# Patient Record
Sex: Female | Born: 1940 | Hispanic: No | Marital: Single | Smoking: Never smoker
Health system: Southern US, Community
[De-identification: ages and names within clinical notes are randomized; demographics above are authoritative.]

## PROBLEM LIST (undated history)

## (undated) DIAGNOSIS — I10 Essential (primary) hypertension: Secondary | ICD-10-CM

---

## 1999-10-24 ENCOUNTER — Other Ambulatory Visit: Admission: RE | Admit: 1999-10-24 | Discharge: 1999-10-24 | Payer: Self-pay | Admitting: Family Medicine

## 2001-04-05 ENCOUNTER — Other Ambulatory Visit: Admission: RE | Admit: 2001-04-05 | Discharge: 2001-04-05 | Payer: Self-pay | Admitting: Family Medicine

## 2005-02-17 ENCOUNTER — Other Ambulatory Visit: Admission: RE | Admit: 2005-02-17 | Discharge: 2005-02-17 | Payer: Self-pay | Admitting: Family Medicine

## 2006-07-30 ENCOUNTER — Other Ambulatory Visit: Admission: RE | Admit: 2006-07-30 | Discharge: 2006-07-30 | Payer: Self-pay | Admitting: Family Medicine

## 2008-03-03 ENCOUNTER — Encounter: Admission: RE | Admit: 2008-03-03 | Discharge: 2008-03-03 | Payer: Self-pay | Admitting: Family Medicine

## 2008-10-15 ENCOUNTER — Other Ambulatory Visit: Admission: RE | Admit: 2008-10-15 | Discharge: 2008-10-15 | Payer: Self-pay | Admitting: Family Medicine

## 2009-09-21 ENCOUNTER — Other Ambulatory Visit: Admission: RE | Admit: 2009-09-21 | Discharge: 2009-09-21 | Payer: Self-pay | Admitting: Family Medicine

## 2009-11-22 ENCOUNTER — Encounter: Admission: RE | Admit: 2009-11-22 | Discharge: 2009-11-22 | Payer: Self-pay | Admitting: Family Medicine

## 2009-11-29 ENCOUNTER — Encounter (INDEPENDENT_AMBULATORY_CARE_PROVIDER_SITE_OTHER): Payer: Self-pay | Admitting: Obstetrics and Gynecology

## 2009-11-29 ENCOUNTER — Ambulatory Visit (HOSPITAL_COMMUNITY): Admission: RE | Admit: 2009-11-29 | Discharge: 2009-11-30 | Payer: Self-pay | Admitting: Obstetrics and Gynecology

## 2010-06-20 ENCOUNTER — Encounter: Admission: RE | Admit: 2010-06-20 | Discharge: 2010-06-20 | Payer: Self-pay | Admitting: Family Medicine

## 2010-08-05 HISTORY — PX: LUMBAR DISC SURGERY: SHX700

## 2010-10-24 ENCOUNTER — Other Ambulatory Visit: Payer: Self-pay | Admitting: Family Medicine

## 2010-12-19 LAB — COMPREHENSIVE METABOLIC PANEL
ALT: 13 U/L (ref 0–35)
AST: 19 U/L (ref 0–37)
BUN: 7 mg/dL (ref 6–23)
Chloride: 101 mEq/L (ref 96–112)
GFR calc Af Amer: >60 mL/min (ref >60)
GFR calc non Af Amer: >60 mL/min (ref >60)
Potassium: 3.6 mEq/L (ref 3.5–5.1)
Sodium: 140 mEq/L (ref 135–145)

## 2010-12-19 LAB — CBC
HCT: 30.2 % — ABNORMAL LOW (ref 36.0–46.0)
MCHC: 33 g/dL (ref 30.0–36.0)
Platelets: 216 10*3/uL (ref 150–400)
WBC: 13.4 10*3/uL — ABNORMAL HIGH (ref 4.0–10.5)

## 2011-02-01 HISTORY — PX: POSTERIOR LUMBAR FUSION: SHX6036

## 2011-02-23 ENCOUNTER — Ambulatory Visit: Payer: Medicaid Other | Admitting: Rehabilitative and Restorative Service Providers"

## 2011-03-15 ENCOUNTER — Ambulatory Visit: Payer: Medicaid Other | Attending: Neurology | Admitting: Rehabilitative and Restorative Service Providers"

## 2011-03-15 DIAGNOSIS — M545 Low back pain, unspecified: Secondary | ICD-10-CM | POA: Insufficient documentation

## 2011-03-15 DIAGNOSIS — IMO0001 Reserved for inherently not codable concepts without codable children: Secondary | ICD-10-CM | POA: Insufficient documentation

## 2011-03-15 DIAGNOSIS — M2569 Stiffness of other specified joint, not elsewhere classified: Secondary | ICD-10-CM | POA: Insufficient documentation

## 2011-03-31 ENCOUNTER — Ambulatory Visit: Payer: Medicare Other | Attending: Neurology | Admitting: Physical Therapy

## 2011-03-31 DIAGNOSIS — IMO0001 Reserved for inherently not codable concepts without codable children: Secondary | ICD-10-CM | POA: Insufficient documentation

## 2011-03-31 DIAGNOSIS — M545 Low back pain, unspecified: Secondary | ICD-10-CM | POA: Insufficient documentation

## 2011-03-31 DIAGNOSIS — M2569 Stiffness of other specified joint, not elsewhere classified: Secondary | ICD-10-CM | POA: Insufficient documentation

## 2011-04-07 ENCOUNTER — Ambulatory Visit: Payer: Medicare Other | Admitting: Rehabilitative and Restorative Service Providers"

## 2011-04-14 ENCOUNTER — Ambulatory Visit: Payer: Medicare Other | Admitting: Rehabilitative and Restorative Service Providers"

## 2011-04-19 ENCOUNTER — Ambulatory Visit: Payer: Self-pay | Attending: Neurology | Admitting: Rehabilitative and Restorative Service Providers"

## 2011-04-19 DIAGNOSIS — M2560 Stiffness of unspecified joint, not elsewhere classified: Secondary | ICD-10-CM | POA: Insufficient documentation

## 2011-04-19 DIAGNOSIS — IMO0001 Reserved for inherently not codable concepts without codable children: Secondary | ICD-10-CM | POA: Insufficient documentation

## 2011-04-25 ENCOUNTER — Encounter: Payer: Medicaid Other | Admitting: Rehabilitative and Restorative Service Providers"

## 2011-04-27 ENCOUNTER — Encounter: Payer: Medicaid Other | Admitting: Rehabilitative and Restorative Service Providers"

## 2011-05-08 ENCOUNTER — Encounter: Payer: Medicaid Other | Admitting: Rehabilitative and Restorative Service Providers"

## 2011-05-10 ENCOUNTER — Encounter: Payer: Medicaid Other | Admitting: Rehabilitative and Restorative Service Providers"

## 2011-09-26 HISTORY — PX: CATARACT EXTRACTION, BILATERAL: SHX1313

## 2011-10-03 ENCOUNTER — Other Ambulatory Visit: Payer: Self-pay | Admitting: Family Medicine

## 2011-10-03 DIAGNOSIS — Z1231 Encounter for screening mammogram for malignant neoplasm of breast: Secondary | ICD-10-CM

## 2011-10-10 ENCOUNTER — Ambulatory Visit
Admission: RE | Admit: 2011-10-10 | Discharge: 2011-10-10 | Disposition: A | Discharge status: Home / Self Care | Payer: Medicare Other | Source: Ambulatory Visit | Attending: Family Medicine | Admitting: Family Medicine

## 2011-10-10 DIAGNOSIS — Z1231 Encounter for screening mammogram for malignant neoplasm of breast: Secondary | ICD-10-CM

## 2012-04-04 DIAGNOSIS — M960 Pseudarthrosis after fusion or arthrodesis: Secondary | ICD-10-CM | POA: Insufficient documentation

## 2012-09-06 ENCOUNTER — Other Ambulatory Visit: Payer: Self-pay | Admitting: Family Medicine

## 2012-09-06 DIAGNOSIS — R1031 Right lower quadrant pain: Secondary | ICD-10-CM

## 2012-09-09 ENCOUNTER — Ambulatory Visit
Admission: RE | Admit: 2012-09-09 | Discharge: 2012-09-09 | Disposition: A | Discharge status: Home / Self Care | Payer: Medicare Other | Source: Ambulatory Visit | Attending: Family Medicine | Admitting: Family Medicine

## 2012-09-09 DIAGNOSIS — R1031 Right lower quadrant pain: Secondary | ICD-10-CM

## 2012-09-09 MED ORDER — IOHEXOL 300 MG/ML  SOLN
80.0000 mL | Freq: Once | INTRAMUSCULAR | Status: AC | PRN
  Administered 2012-09-09: 80 mL via INTRAVENOUS

## 2014-10-19 ENCOUNTER — Other Ambulatory Visit: Payer: Self-pay

## 2014-10-19 DIAGNOSIS — Z1231 Encounter for screening mammogram for malignant neoplasm of breast: Secondary | ICD-10-CM

## 2014-10-26 ENCOUNTER — Ambulatory Visit
Admission: RE | Admit: 2014-10-26 | Discharge: 2014-10-26 | Disposition: A | Discharge status: Home / Self Care | Payer: Medicare Other | Source: Ambulatory Visit

## 2014-10-26 DIAGNOSIS — Z1231 Encounter for screening mammogram for malignant neoplasm of breast: Secondary | ICD-10-CM

## 2014-10-27 ENCOUNTER — Other Ambulatory Visit: Payer: Self-pay | Admitting: Family Medicine

## 2014-10-27 DIAGNOSIS — R928 Other abnormal and inconclusive findings on diagnostic imaging of breast: Secondary | ICD-10-CM

## 2014-11-09 ENCOUNTER — Inpatient Hospital Stay: Admission: RE | Admit: 2014-11-09 | Payer: Medicare Other | Source: Ambulatory Visit

## 2014-11-12 ENCOUNTER — Ambulatory Visit
Admission: RE | Admit: 2014-11-12 | Discharge: 2014-11-12 | Disposition: A | Discharge status: Home / Self Care | Payer: Medicare Other | Source: Ambulatory Visit | Attending: Family Medicine | Admitting: Family Medicine

## 2014-11-12 DIAGNOSIS — R928 Other abnormal and inconclusive findings on diagnostic imaging of breast: Secondary | ICD-10-CM

## 2016-05-18 ENCOUNTER — Other Ambulatory Visit: Payer: Self-pay | Admitting: Family Medicine

## 2016-05-18 DIAGNOSIS — Z1231 Encounter for screening mammogram for malignant neoplasm of breast: Secondary | ICD-10-CM

## 2016-06-05 ENCOUNTER — Ambulatory Visit
Admission: RE | Admit: 2016-06-05 | Discharge: 2016-06-05 | Disposition: A | Discharge status: Home / Self Care | Payer: Medicare Other | Source: Ambulatory Visit | Attending: Family Medicine | Admitting: Family Medicine

## 2016-06-05 DIAGNOSIS — Z1231 Encounter for screening mammogram for malignant neoplasm of breast: Secondary | ICD-10-CM

## 2019-08-25 ENCOUNTER — Other Ambulatory Visit: Payer: Self-pay | Admitting: Family Medicine

## 2019-08-25 DIAGNOSIS — N644 Mastodynia: Secondary | ICD-10-CM

## 2019-08-25 DIAGNOSIS — M810 Age-related osteoporosis without current pathological fracture: Secondary | ICD-10-CM

## 2019-10-01 ENCOUNTER — Other Ambulatory Visit: Payer: Self-pay | Admitting: Family Medicine

## 2019-10-01 DIAGNOSIS — Z1231 Encounter for screening mammogram for malignant neoplasm of breast: Secondary | ICD-10-CM

## 2019-12-18 ENCOUNTER — Other Ambulatory Visit: Payer: Self-pay

## 2019-12-18 ENCOUNTER — Ambulatory Visit
Admission: RE | Admit: 2019-12-18 | Discharge: 2019-12-18 | Disposition: A | Discharge status: Home / Self Care | Payer: Medicare Other | Source: Ambulatory Visit | Attending: Family Medicine | Admitting: Family Medicine

## 2019-12-18 DIAGNOSIS — Z1231 Encounter for screening mammogram for malignant neoplasm of breast: Secondary | ICD-10-CM

## 2019-12-18 DIAGNOSIS — M810 Age-related osteoporosis without current pathological fracture: Secondary | ICD-10-CM

## 2020-07-21 ENCOUNTER — Other Ambulatory Visit (HOSPITAL_COMMUNITY): Payer: Self-pay | Admitting: Family Medicine

## 2020-07-21 DIAGNOSIS — I491 Atrial premature depolarization: Secondary | ICD-10-CM

## 2020-08-11 ENCOUNTER — Ambulatory Visit (HOSPITAL_COMMUNITY): Payer: Medicare Other | Attending: Internal Medicine

## 2020-08-11 ENCOUNTER — Other Ambulatory Visit: Payer: Self-pay

## 2020-08-11 DIAGNOSIS — I351 Nonrheumatic aortic (valve) insufficiency: Secondary | ICD-10-CM | POA: Diagnosis not present

## 2020-08-11 DIAGNOSIS — I517 Cardiomegaly: Secondary | ICD-10-CM | POA: Diagnosis not present

## 2020-08-11 DIAGNOSIS — I491 Atrial premature depolarization: Secondary | ICD-10-CM | POA: Insufficient documentation

## 2020-08-11 DIAGNOSIS — I350 Nonrheumatic aortic (valve) stenosis: Secondary | ICD-10-CM

## 2020-08-11 LAB — ECHOCARDIOGRAM COMPLETE
Area-P 1/2: 2.95 cm2
P 1/2 time: 530 msec
S' Lateral: 3 cm

## 2020-10-05 DIAGNOSIS — H52203 Unspecified astigmatism, bilateral: Secondary | ICD-10-CM | POA: Diagnosis not present

## 2020-10-05 DIAGNOSIS — H1789 Other corneal scars and opacities: Secondary | ICD-10-CM | POA: Diagnosis not present

## 2020-10-05 DIAGNOSIS — Z9889 Other specified postprocedural states: Secondary | ICD-10-CM | POA: Diagnosis not present

## 2020-10-05 DIAGNOSIS — Z961 Presence of intraocular lens: Secondary | ICD-10-CM | POA: Diagnosis not present

## 2020-10-05 DIAGNOSIS — H11063 Recurrent pterygium of eye, bilateral: Secondary | ICD-10-CM | POA: Diagnosis not present

## 2020-10-05 DIAGNOSIS — H11003 Unspecified pterygium of eye, bilateral: Secondary | ICD-10-CM | POA: Diagnosis not present

## 2020-10-05 DIAGNOSIS — H52212 Irregular astigmatism, left eye: Secondary | ICD-10-CM | POA: Diagnosis not present

## 2020-10-05 DIAGNOSIS — H26492 Other secondary cataract, left eye: Secondary | ICD-10-CM | POA: Diagnosis not present

## 2020-10-05 DIAGNOSIS — H179 Unspecified corneal scar and opacity: Secondary | ICD-10-CM | POA: Diagnosis not present

## 2020-10-07 DIAGNOSIS — H11002 Unspecified pterygium of left eye: Secondary | ICD-10-CM | POA: Insufficient documentation

## 2020-11-04 DIAGNOSIS — E785 Hyperlipidemia, unspecified: Secondary | ICD-10-CM | POA: Insufficient documentation

## 2020-11-04 DIAGNOSIS — I10 Essential (primary) hypertension: Secondary | ICD-10-CM | POA: Insufficient documentation

## 2020-11-04 DIAGNOSIS — M810 Age-related osteoporosis without current pathological fracture: Secondary | ICD-10-CM | POA: Insufficient documentation

## 2020-11-15 DIAGNOSIS — H11002 Unspecified pterygium of left eye: Secondary | ICD-10-CM | POA: Diagnosis not present

## 2020-11-15 DIAGNOSIS — H11062 Recurrent pterygium of left eye: Secondary | ICD-10-CM | POA: Diagnosis not present

## 2020-11-15 HISTORY — PX: PTERYGIUM EXCISION: SHX2273

## 2020-11-16 DIAGNOSIS — Z9841 Cataract extraction status, right eye: Secondary | ICD-10-CM | POA: Diagnosis not present

## 2020-11-16 DIAGNOSIS — Z9889 Other specified postprocedural states: Secondary | ICD-10-CM | POA: Diagnosis not present

## 2020-11-16 DIAGNOSIS — Z961 Presence of intraocular lens: Secondary | ICD-10-CM | POA: Diagnosis not present

## 2020-11-16 DIAGNOSIS — H11063 Recurrent pterygium of eye, bilateral: Secondary | ICD-10-CM | POA: Diagnosis not present

## 2020-11-16 DIAGNOSIS — H52203 Unspecified astigmatism, bilateral: Secondary | ICD-10-CM | POA: Diagnosis not present

## 2020-11-16 DIAGNOSIS — Z48810 Encounter for surgical aftercare following surgery on the sense organs: Secondary | ICD-10-CM | POA: Diagnosis not present

## 2020-11-16 DIAGNOSIS — Z9842 Cataract extraction status, left eye: Secondary | ICD-10-CM | POA: Diagnosis not present

## 2020-11-16 DIAGNOSIS — H179 Unspecified corneal scar and opacity: Secondary | ICD-10-CM | POA: Diagnosis not present

## 2020-11-16 DIAGNOSIS — H26492 Other secondary cataract, left eye: Secondary | ICD-10-CM | POA: Diagnosis not present

## 2020-11-17 DIAGNOSIS — Z01812 Encounter for preprocedural laboratory examination: Secondary | ICD-10-CM | POA: Diagnosis not present

## 2020-11-22 DIAGNOSIS — K621 Rectal polyp: Secondary | ICD-10-CM | POA: Diagnosis not present

## 2020-11-22 DIAGNOSIS — D123 Benign neoplasm of transverse colon: Secondary | ICD-10-CM | POA: Diagnosis not present

## 2020-11-22 DIAGNOSIS — R195 Other fecal abnormalities: Secondary | ICD-10-CM | POA: Diagnosis not present

## 2020-11-23 DIAGNOSIS — Z9842 Cataract extraction status, left eye: Secondary | ICD-10-CM | POA: Diagnosis not present

## 2020-11-23 DIAGNOSIS — Z961 Presence of intraocular lens: Secondary | ICD-10-CM | POA: Diagnosis not present

## 2020-11-23 DIAGNOSIS — H179 Unspecified corneal scar and opacity: Secondary | ICD-10-CM | POA: Diagnosis not present

## 2020-11-23 DIAGNOSIS — H52203 Unspecified astigmatism, bilateral: Secondary | ICD-10-CM | POA: Diagnosis not present

## 2020-11-23 DIAGNOSIS — Z48810 Encounter for surgical aftercare following surgery on the sense organs: Secondary | ICD-10-CM | POA: Diagnosis not present

## 2020-11-23 DIAGNOSIS — Z9889 Other specified postprocedural states: Secondary | ICD-10-CM | POA: Diagnosis not present

## 2020-11-23 DIAGNOSIS — Z9841 Cataract extraction status, right eye: Secondary | ICD-10-CM | POA: Diagnosis not present

## 2020-11-23 DIAGNOSIS — H11003 Unspecified pterygium of eye, bilateral: Secondary | ICD-10-CM | POA: Diagnosis not present

## 2020-11-23 DIAGNOSIS — H26492 Other secondary cataract, left eye: Secondary | ICD-10-CM | POA: Diagnosis not present

## 2020-11-24 DIAGNOSIS — D123 Benign neoplasm of transverse colon: Secondary | ICD-10-CM | POA: Diagnosis not present

## 2020-12-03 DIAGNOSIS — Z79899 Other long term (current) drug therapy: Secondary | ICD-10-CM | POA: Diagnosis not present

## 2020-12-03 DIAGNOSIS — Z7952 Long term (current) use of systemic steroids: Secondary | ICD-10-CM | POA: Diagnosis not present

## 2020-12-03 DIAGNOSIS — H179 Unspecified corneal scar and opacity: Secondary | ICD-10-CM | POA: Diagnosis not present

## 2020-12-03 DIAGNOSIS — H11003 Unspecified pterygium of eye, bilateral: Secondary | ICD-10-CM | POA: Diagnosis not present

## 2020-12-28 DIAGNOSIS — H11003 Unspecified pterygium of eye, bilateral: Secondary | ICD-10-CM | POA: Diagnosis not present

## 2020-12-28 DIAGNOSIS — Z961 Presence of intraocular lens: Secondary | ICD-10-CM | POA: Diagnosis not present

## 2021-02-28 DIAGNOSIS — Z961 Presence of intraocular lens: Secondary | ICD-10-CM | POA: Diagnosis not present

## 2021-02-28 DIAGNOSIS — Z9889 Other specified postprocedural states: Secondary | ICD-10-CM | POA: Diagnosis not present

## 2021-02-28 DIAGNOSIS — H02005 Unspecified entropion of left lower eyelid: Secondary | ICD-10-CM | POA: Diagnosis not present

## 2021-02-28 DIAGNOSIS — H52212 Irregular astigmatism, left eye: Secondary | ICD-10-CM | POA: Diagnosis not present

## 2021-02-28 DIAGNOSIS — Z09 Encounter for follow-up examination after completed treatment for conditions other than malignant neoplasm: Secondary | ICD-10-CM | POA: Diagnosis not present

## 2021-02-28 DIAGNOSIS — Z8669 Personal history of other diseases of the nervous system and sense organs: Secondary | ICD-10-CM | POA: Diagnosis not present

## 2021-02-28 DIAGNOSIS — H179 Unspecified corneal scar and opacity: Secondary | ICD-10-CM | POA: Diagnosis not present

## 2021-06-06 DIAGNOSIS — Z9889 Other specified postprocedural states: Secondary | ICD-10-CM | POA: Diagnosis not present

## 2021-06-06 DIAGNOSIS — Z961 Presence of intraocular lens: Secondary | ICD-10-CM | POA: Diagnosis not present

## 2021-06-06 DIAGNOSIS — H11002 Unspecified pterygium of left eye: Secondary | ICD-10-CM | POA: Diagnosis not present

## 2021-06-06 DIAGNOSIS — H02006 Unspecified entropion of left eye, unspecified eyelid: Secondary | ICD-10-CM | POA: Diagnosis not present

## 2021-07-05 DIAGNOSIS — H02035 Senile entropion of left lower eyelid: Secondary | ICD-10-CM | POA: Diagnosis not present

## 2021-08-10 DIAGNOSIS — M542 Cervicalgia: Secondary | ICD-10-CM | POA: Diagnosis not present

## 2021-08-10 DIAGNOSIS — Z23 Encounter for immunization: Secondary | ICD-10-CM | POA: Diagnosis not present

## 2021-08-10 DIAGNOSIS — K635 Polyp of colon: Secondary | ICD-10-CM | POA: Diagnosis not present

## 2021-08-10 DIAGNOSIS — M810 Age-related osteoporosis without current pathological fracture: Secondary | ICD-10-CM | POA: Diagnosis not present

## 2021-08-10 DIAGNOSIS — Z0000 Encounter for general adult medical examination without abnormal findings: Secondary | ICD-10-CM | POA: Diagnosis not present

## 2021-08-10 DIAGNOSIS — I10 Essential (primary) hypertension: Secondary | ICD-10-CM | POA: Diagnosis not present

## 2021-08-10 DIAGNOSIS — I700 Atherosclerosis of aorta: Secondary | ICD-10-CM | POA: Diagnosis not present

## 2021-08-17 ENCOUNTER — Other Ambulatory Visit: Payer: Self-pay | Admitting: Family Medicine

## 2021-08-17 DIAGNOSIS — M810 Age-related osteoporosis without current pathological fracture: Secondary | ICD-10-CM

## 2021-08-17 DIAGNOSIS — Z1231 Encounter for screening mammogram for malignant neoplasm of breast: Secondary | ICD-10-CM

## 2021-09-17 DIAGNOSIS — H04123 Dry eye syndrome of bilateral lacrimal glands: Secondary | ICD-10-CM | POA: Diagnosis not present

## 2022-01-17 DIAGNOSIS — H02055 Trichiasis without entropian left lower eyelid: Secondary | ICD-10-CM | POA: Diagnosis not present

## 2022-01-17 DIAGNOSIS — R051 Acute cough: Secondary | ICD-10-CM | POA: Diagnosis not present

## 2022-02-08 ENCOUNTER — Ambulatory Visit
Admission: RE | Admit: 2022-02-08 | Discharge: 2022-02-08 | Disposition: A | Discharge status: Home / Self Care | Payer: Medicare Other | Source: Ambulatory Visit | Attending: Family Medicine | Admitting: Family Medicine

## 2022-02-08 ENCOUNTER — Encounter: Payer: Self-pay | Admitting: Radiology

## 2022-02-08 DIAGNOSIS — M8589 Other specified disorders of bone density and structure, multiple sites: Secondary | ICD-10-CM | POA: Diagnosis not present

## 2022-02-08 DIAGNOSIS — Z1231 Encounter for screening mammogram for malignant neoplasm of breast: Secondary | ICD-10-CM | POA: Diagnosis not present

## 2022-02-08 DIAGNOSIS — Z780 Asymptomatic menopausal state: Secondary | ICD-10-CM | POA: Diagnosis not present

## 2022-02-08 DIAGNOSIS — M810 Age-related osteoporosis without current pathological fracture: Secondary | ICD-10-CM

## 2022-08-06 DIAGNOSIS — S46811A Strain of other muscles, fascia and tendons at shoulder and upper arm level, right arm, initial encounter: Secondary | ICD-10-CM | POA: Diagnosis not present

## 2022-08-11 ENCOUNTER — Other Ambulatory Visit: Payer: Self-pay

## 2022-08-11 ENCOUNTER — Encounter (HOSPITAL_BASED_OUTPATIENT_CLINIC_OR_DEPARTMENT_OTHER): Payer: Self-pay

## 2022-08-11 ENCOUNTER — Emergency Department (HOSPITAL_BASED_OUTPATIENT_CLINIC_OR_DEPARTMENT_OTHER): Payer: Medicare Other

## 2022-08-11 ENCOUNTER — Emergency Department (HOSPITAL_BASED_OUTPATIENT_CLINIC_OR_DEPARTMENT_OTHER): Payer: Medicare Other | Admitting: Radiology

## 2022-08-11 ENCOUNTER — Emergency Department (HOSPITAL_BASED_OUTPATIENT_CLINIC_OR_DEPARTMENT_OTHER)
Admission: EM | Admit: 2022-08-11 | Discharge: 2022-08-11 | Disposition: A | Discharge status: Home / Self Care | Payer: Medicare Other | Attending: Emergency Medicine | Admitting: Emergency Medicine

## 2022-08-11 DIAGNOSIS — I10 Essential (primary) hypertension: Secondary | ICD-10-CM | POA: Diagnosis not present

## 2022-08-11 DIAGNOSIS — G319 Degenerative disease of nervous system, unspecified: Secondary | ICD-10-CM | POA: Diagnosis not present

## 2022-08-11 DIAGNOSIS — R519 Headache, unspecified: Secondary | ICD-10-CM | POA: Insufficient documentation

## 2022-08-11 DIAGNOSIS — M79601 Pain in right arm: Secondary | ICD-10-CM

## 2022-08-11 DIAGNOSIS — R079 Chest pain, unspecified: Secondary | ICD-10-CM | POA: Diagnosis not present

## 2022-08-11 DIAGNOSIS — M25511 Pain in right shoulder: Secondary | ICD-10-CM | POA: Insufficient documentation

## 2022-08-11 DIAGNOSIS — R0789 Other chest pain: Secondary | ICD-10-CM | POA: Diagnosis not present

## 2022-08-11 DIAGNOSIS — Z7982 Long term (current) use of aspirin: Secondary | ICD-10-CM | POA: Diagnosis not present

## 2022-08-11 DIAGNOSIS — I6381 Other cerebral infarction due to occlusion or stenosis of small artery: Secondary | ICD-10-CM | POA: Diagnosis not present

## 2022-08-11 HISTORY — DX: Essential (primary) hypertension: I10

## 2022-08-11 LAB — CBC
HCT: 36.6 % (ref 36.0–46.0)
Hemoglobin: 11.8 g/dL — ABNORMAL LOW (ref 12.0–15.0)
MCH: 28.7 pg (ref 26.0–34.0)
MCHC: 32.2 g/dL (ref 30.0–36.0)
MCV: 89.1 fL (ref 80.0–100.0)
Platelets: 314 10*3/uL (ref 150–400)
RBC: 4.11 MIL/uL (ref 3.87–5.11)
RDW: 12.5 % (ref 11.5–15.5)
WBC: 6.5 10*3/uL (ref 4.0–10.5)
nRBC: 0 % (ref 0.0–0.2)

## 2022-08-11 LAB — BASIC METABOLIC PANEL
Anion gap: 9 (ref 5–15)
BUN: 15 mg/dL (ref 8–23)
CO2: 28 mmol/L (ref 22–32)
Calcium: 9.5 mg/dL (ref 8.9–10.3)
Chloride: 102 mmol/L (ref 98–111)
Creatinine, Ser: 0.52 mg/dL (ref 0.44–1.00)
GFR, Estimated: >60 mL/min (ref >60)
Glucose, Bld: 161 mg/dL — ABNORMAL HIGH (ref 70–99)
Potassium: 4.1 mmol/L (ref 3.5–5.1)
Sodium: 139 mmol/L (ref 135–145)

## 2022-08-11 LAB — TROPONIN I (HIGH SENSITIVITY)
Troponin I (High Sensitivity): 6 ng/L (ref <18)
Troponin I (High Sensitivity): 7 ng/L (ref <18)

## 2022-08-11 MED ORDER — ACETAMINOPHEN 500 MG PO TABS
1000.0000 mg | ORAL_TABLET | Freq: Once | ORAL | Status: AC
  Administered 2022-08-11: 1000 mg via ORAL
  Filled 2022-08-11: qty 2

## 2022-08-11 MED ORDER — HYDROCHLOROTHIAZIDE 25 MG PO TABS: 25.0000 mg | ORAL_TABLET | Freq: Every day | ORAL | 0 refills | Status: DC

## 2022-08-11 MED ORDER — HYDROCHLOROTHIAZIDE 25 MG PO TABS: 25.0000 mg | ORAL_TABLET | Freq: Every day | ORAL | 0 refills | Status: AC

## 2022-08-11 MED ORDER — LOSARTAN POTASSIUM 25 MG PO TABS
100.0000 mg | ORAL_TABLET | Freq: Once | ORAL | Status: AC
  Administered 2022-08-11: 100 mg via ORAL
  Filled 2022-08-11: qty 4

## 2022-08-11 MED ORDER — ASPIRIN 81 MG PO CHEW: 81.0000 mg | CHEWABLE_TABLET | Freq: Every day | ORAL | 2 refills | Status: DC

## 2022-08-11 MED ORDER — ASPIRIN 81 MG PO CHEW: 81.0000 mg | CHEWABLE_TABLET | Freq: Every day | ORAL | 2 refills | Status: AC

## 2022-08-11 MED ORDER — CYCLOBENZAPRINE HCL 5 MG PO TABS
5.0000 mg | ORAL_TABLET | Freq: Once | ORAL | Status: AC
  Administered 2022-08-11: 5 mg via ORAL
  Filled 2022-08-11: qty 1

## 2022-08-11 NOTE — ED Notes (Signed)
Pt taken to CT.

## 2022-08-11 NOTE — ED Provider Notes (Signed)
[facility] EMERGENCY DEPT Provider Note   CSN: [id number] Arrival date & time: 08/11/22  1357     History {Add pertinent medical, surgical, social history, OB history to HPI:1} Chief Complaint  Patient presents with   Hypertension   Chest Pain    Emma Johnston is a 81 y.o. female with a past medical history of hypertension presenting to the emergency department for evaluation of right shoulder pain and right arm pain.  Patient states she has been having right shoulder pain radiating to her right arm in the last 2-3 weeks.  The pain is constant, dull, aching starting from her right scapula to the right arm.  Denies any tingling or numbness sensation.  She also reports fatigue and headache since yesterday.  Patient has history of hypertension and is on losartan 100 mg.  Blood pressure was in the 190s to 200s when she checked yesterday followed by headache and dizziness.  Reports taking 2 losartan instead of taking 1 as usual yesterday.  Denies any chest pain, shortness of breath, vision changes, nausea, vomiting, constipation, diarrhea, urinary symptoms, fever.  Hypertension Associated symptoms include chest pain.  Chest Pain      Home Medications Prior to Admission medications   Medication Sig Start Date End Date Taking? Authorizing Provider  aspirin 81 MG chewable tablet Chew 1 tablet (81 mg total) by mouth daily. 08/11/22 11/09/22  Rex Kras, PA  hydrochlorothiazide (HYDRODIURIL) 25 MG tablet Take 1 tablet (25 mg total) by mouth daily. 08/11/22   Rex Kras, PA      Allergies    Patient has no known allergies.    Review of Systems   Review of Systems  Cardiovascular:  Positive for chest pain.    Physical Exam Updated Vital Signs BP (!) 193/94   Pulse 72   Temp 97.9 F (36.6 C) (Oral)   Resp 16   SpO2 100%  Physical Exam Vitals and nursing note reviewed.  Constitutional:      Appearance: Normal appearance.  HENT:     Head: Normocephalic and atraumatic.      Mouth/Throat:     Mouth: Mucous membranes are moist.  Eyes:     General: No scleral icterus. Cardiovascular:     Rate and Rhythm: Normal rate and regular rhythm.     Pulses: Normal pulses.     Heart sounds: Normal heart sounds.  Pulmonary:     Effort: Pulmonary effort is normal.     Breath sounds: Normal breath sounds.  Abdominal:     General: Abdomen is flat.     Palpations: Abdomen is soft.     Tenderness: There is no abdominal tenderness.  Musculoskeletal:        General: No deformity.     Comments: Tenderness to palpation to right scapular.  No cervical spine, thoracic spine, lumbar spine midline tenderness.  Skin:    General: Skin is warm.     Findings: No rash.  Neurological:     General: No focal deficit present.     Mental Status: She is alert.  Psychiatric:        Mood and Affect: Mood normal.     ED Results / Procedures / Treatments   Labs (all labs ordered are listed, but only abnormal results are displayed) Labs Reviewed  BASIC METABOLIC PANEL - Abnormal; Notable for the following components:      Result Value   Glucose, Bld 161 (*)    All other components within normal limits  CBC -  Abnormal; Notable for the following components:   Hemoglobin 11.8 (*)    All other components within normal limits  TROPONIN I (HIGH SENSITIVITY)  TROPONIN I (HIGH SENSITIVITY)    EKG EKG Interpretation  Date/Time:  Friday August 11 2022 14:08:57 EST Ventricular Rate:  77 PR Interval:  118 QRS Duration: 82 QT Interval:  400 QTC Calculation: 452 R Axis:   57 Text Interpretation: Normal sinus rhythm Septal infarct , age undetermined Abnormal ECG No previous ECGs available Confirmed by Aletta Edouard [phone]) on 08/11/2022 2:17:42 PM  Radiology MR BRAIN WO CONTRAST  Result Date: 08/11/2022 CLINICAL DATA:  Persistent headache for 2 weeks. Hypertension. Abnormal CT head 08/11/2022. EXAM: MRI HEAD WITHOUT CONTRAST TECHNIQUE: Multiplanar, multiecho pulse sequences of the  brain and surrounding structures were obtained without intravenous contrast. COMPARISON:  None Available. 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 There is some T2 shine through on the trace diffusion images but no significant restricted diffusion on the ADC map. Moderate confluent periventricular and subcortical T2 hyperintensities are present bilaterally. Mild generalized atrophy is present. The ventricles are proportionate to the degree of atrophy. No significant extraaxial fluid collection is present. The internal auditory canals are within normal limits. The brainstem and cerebellum are within normal limits. Vascular: Flow is present in the major intracranial arteries. Skull and upper cervical spine: The craniocervical junction is normal. Upper cervical spine is within normal limits. Marrow signal is unremarkable. Sinuses/Orbits: The paranasal sinuses and mastoid air cells are clear. Bilateral lens replacements are noted. Globes and orbits are otherwise unremarkable. IMPRESSION: 1. No acute intracranial abnormality. 2. 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 3. Moderate confluent periventricular and subcortical T2 hyperintensities bilaterally likely reflect the sequela of chronic microvascular ischemia. 4. Mild generalized atrophy. Electronically Signed   By: San Morelle M.D.   On: 08/11/2022 18:37   CT Head Wo Contrast  Result Date: 08/11/2022 CLINICAL DATA:  Headache.  Fatigue. EXAM: CT HEAD WITHOUT CONTRAST TECHNIQUE: Contiguous axial images were obtained from the base of the skull through the vertex without intravenous contrast. RADIATION DOSE REDUCTION: This exam was performed according to the departmental dose-optimization program which includes automated exposure control, adjustment of the mA and/or kV according to patient size and/or use of iterative reconstruction technique. COMPARISON:  None Available. FINDINGS: Brain: There is no acute intracranial hemorrhage, extra-axial  fluid collection, or acute territorial infarct. There is a small focus of hypodensity in the left thalamus suspicious for age-indeterminate but possibly acute to subacute infarct. Parenchymal volume is normal for age. The ventricles are normal in size. Gray-white differentiation is preserved. There is extensive confluence hypodensity in the supratentorial white matter likely reflecting sequela of chronic small-vessel ischemic change. There is a small remote lacunar infarct in the left lentiform nucleus. There is no mass lesion.  There is no mass effect or midline shift. Vascular: There is calcification of the bilateral carotid siphons and vertebral arteries. Skull: Normal. Negative for fracture or focal lesion. Sinuses/Orbits: The imaged paranasal sinuses are clear. The imaged globes and orbits are unremarkable. Other: None. IMPRESSION: 1. Age indeterminate but possibly acute to subacute small infarct in the left thalamus. If there is clinical concern for acute infarct, MRI is recommended. 2. Otherwise, no acute intracranial pathology. Background chronic small-vessel ischemic change as above. Electronically Signed   By: Valetta Mole M.D.   On: 08/11/2022 15:23   DG Chest 2 View  Result Date: 08/11/2022 CLINICAL DATA:  Chest pain EXAM: CHEST - 2  VIEW COMPARISON:  12/20/2015 FINDINGS: Transverse diameter of heart is increased. There are no signs of pulmonary edema or focal pulmonary consolidation. There is no pleural effusion or pneumothorax. There is surgical fusion in lumbar spine. Degenerative changes are noted in upper lumbar spine. IMPRESSION: Cardiomegaly. There are no signs of pulmonary edema or focal pulmonary consolidation. Electronically Signed   By: Elmer Picker M.D.   On: 08/11/2022 14:42    Procedures Procedures  {Document cardiac monitor, telemetry assessment procedure when appropriate:1}  Medications Ordered in ED Medications  acetaminophen (TYLENOL) tablet 1,000 mg (1,000 mg Oral  Given 08/11/22 1551)  cyclobenzaprine (FLEXERIL) tablet 5 mg (5 mg Oral Given 08/11/22 1551)  losartan (COZAAR) tablet 100 mg (100 mg Oral Given 08/11/22 1941)    ED Course/ Medical Decision Making/ A&P                           Medical Decision Making Amount and/or Complexity of Data Reviewed Labs: ordered. Radiology: ordered.  Risk OTC drugs. Prescription drug management.   This patient presents to the ED for right shoulder pain, right arm pain, this involves an extensive number of treatment options, and is a complaint that carries with a high risk of complications and morbidity.  The differential diagnosis includes ACS, CVA, ICH, musculoskeletal pain, infectious etiology.  This is not an exhaustive list.  Comorbidities that complicate the patient evaluation See HPI  Social determinants of health NA  Additional history obtained: Additional history obtained from EMR. External records from outside source obtained and review including prior labs  Cardiac monitoring/EKG: The patient was maintained on a cardiac monitor.  I personally reviewed and interpreted the cardiac monitor which showed an underlying rhythm of: Sinus rhythm.  Lab tests: I ordered and personally interpreted labs.  The pertinent results include: WBC unremarkable. Hbg unremarkable. Platelets unremarkable. No electrolyte abnormalities noted. BUN, creatinine unremarkable. Troponin negative Imaging studies: I ordered imaging studies including CT head and MRI brain which showed 1. No acute intracranial abnormality. 2. 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 3. Moderate confluent periventricular and subcortical T2 hyperintensities bilaterally likely reflect the sequela of chronic microvascular ischemia. 4. Mild generalized atrophy. I personally reviewed, interpreted imaging and agree with the radiologist's interpretations.  Problem list/ ED course/ Critical interventions/ Medical management: HPI: See above Vital  signs significant for blood pressure 193/94 otherwise within normal range and stable throughout visit. Laboratory/imaging studies significant for: See above. On physical examination, patient is afebrile and appears in no acute distress.  Neuro exam benign.  There was no focal neurological deficits.  There was tenderness to palpation to the right shoulder mainly on the right scapula.  CT head and MRI brain showed1. No acute intracranial abnormality. 2. 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 3. Moderate confluent periventricular and subcortical T2 hyperintensities bilaterally likely reflect the sequela of chronic microvascular ischemia. 4. Mild generalized atrophy.  Patient's clinical presentations and laboratory/imaging studies are most concerned for musculoskeletal pain.  ACS is unlikely as patient does not have any chest pain, troponin negative.  PE unlikely as PERC negative.  Advised patient to follow-up with PCP for blood pressure control and follow-up with neurology for MRI finding.  I sent in Rx of hydrochlorothiazide for 2 weeks.  Advised patient to take hydrochlorothiazide and until she sees her PCP for blood pressure medication modification. Tylenol and Flexeril ordered. Reevaluation of the patient after these medications showed that the patient improved.   I have  reviewed the patient home medicines and have made adjustments as needed.  Consultations obtained: I requested consultation with neurologist Dr. Cheral Marker, and discussed lab and imaging findings as well as pertinent plan.  They recommend low-dose aspirin 81 mg, blood pressure control and outpatient neurology follow-up.  Disposition Continued outpatient therapy. Follow-up with PCP and neurology recommended for reevaluation of symptoms. Treatment plan discussed with patient.  Pt acknowledged understanding was agreeable to the plan. Worrisome signs and symptoms were discussed with patient, and patient acknowledged understanding to return to the  ED if they noticed these signs and symptoms. Patient was stable upon discharge.   This chart was dictated using voice recognition software.  Despite best efforts to proofread,  errors can occur which can change the documentation meaning.    {Document critical care time when appropriate:1} {Document review of labs and clinical decision tools ie heart score, Chads2Vasc2 etc:1}  {Document your independent review of radiology images, and any outside records:1} {Document your discussion with family members, caretakers, and with consultants:1} {Document social determinants of health affecting pt's care:1} {Document your decision making why or why not admission, treatments were needed:1} Final Clinical Impression(s) / ED Diagnoses Final diagnoses:  Primary hypertension  Right arm pain    Rx / DC Orders ED Discharge Orders          Ordered    aspirin 81 MG chewable tablet  Daily,   Status:  Discontinued        08/11/22 1917    hydrochlorothiazide (HYDRODIURIL) 25 MG tablet  Daily,   Status:  Discontinued        08/11/22 1917    aspirin 81 MG chewable tablet  Daily        08/11/22 1932    hydrochlorothiazide (HYDRODIURIL) 25 MG tablet  Daily        08/11/22 1932

## 2022-08-11 NOTE — ED Triage Notes (Signed)
Interpreter declined, patient wishes to have daughter translate: Pt c/o pain that radiates from her right scapula down her right arm and into her chest for the past two weeks.  Pt reports she also has been a little fatigued and has a mild headache for awhile. Daughter states she has had an elevated BP today as well. She was actually being seen at [facility] today and was recommended to come to the ED due to cp and high blood pressure. Pt AxOx4. NAD

## 2022-08-11 NOTE — Discharge Instructions (Addendum)
Take hydrochlorothiazide once a day after lunch, aspirin in the morning. Please take tylenol/ibuprofen and tizanidine for pain. I recommend close follow-up with PCP and neurology for reevaluation.  Please do not hesitate to return to emergency department if worrisome signs symptoms we discussed become apparent.

## 2022-08-11 NOTE — ED Notes (Signed)
Pt taken to MRI  

## 2022-08-11 NOTE — ED Notes (Signed)
Pt ambulated to bathroom 

## 2022-08-14 DIAGNOSIS — M7541 Impingement syndrome of right shoulder: Secondary | ICD-10-CM | POA: Diagnosis not present

## 2022-08-14 DIAGNOSIS — M25511 Pain in right shoulder: Secondary | ICD-10-CM | POA: Diagnosis not present

## 2022-08-23 DIAGNOSIS — Z0000 Encounter for general adult medical examination without abnormal findings: Secondary | ICD-10-CM | POA: Diagnosis not present

## 2022-08-23 DIAGNOSIS — I77810 Thoracic aortic ectasia: Secondary | ICD-10-CM | POA: Diagnosis not present

## 2022-08-23 DIAGNOSIS — I10 Essential (primary) hypertension: Secondary | ICD-10-CM | POA: Diagnosis not present

## 2022-08-23 DIAGNOSIS — Z8673 Personal history of transient ischemic attack (TIA), and cerebral infarction without residual deficits: Secondary | ICD-10-CM | POA: Diagnosis not present

## 2022-08-23 DIAGNOSIS — Z23 Encounter for immunization: Secondary | ICD-10-CM | POA: Diagnosis not present

## 2022-08-23 DIAGNOSIS — G319 Degenerative disease of nervous system, unspecified: Secondary | ICD-10-CM | POA: Diagnosis not present

## 2022-08-23 DIAGNOSIS — M8580 Other specified disorders of bone density and structure, unspecified site: Secondary | ICD-10-CM | POA: Diagnosis not present

## 2022-08-23 DIAGNOSIS — M549 Dorsalgia, unspecified: Secondary | ICD-10-CM | POA: Diagnosis not present

## 2022-08-23 DIAGNOSIS — I700 Atherosclerosis of aorta: Secondary | ICD-10-CM | POA: Diagnosis not present

## 2022-08-24 ENCOUNTER — Encounter: Payer: Self-pay | Admitting: Family Medicine

## 2022-08-25 ENCOUNTER — Other Ambulatory Visit: Payer: Self-pay | Admitting: Family Medicine

## 2022-08-25 DIAGNOSIS — I77810 Thoracic aortic ectasia: Secondary | ICD-10-CM

## 2022-08-27 DIAGNOSIS — M7541 Impingement syndrome of right shoulder: Secondary | ICD-10-CM | POA: Insufficient documentation

## 2022-09-11 DIAGNOSIS — M7541 Impingement syndrome of right shoulder: Secondary | ICD-10-CM | POA: Diagnosis not present

## 2022-09-11 DIAGNOSIS — S46811A Strain of other muscles, fascia and tendons at shoulder and upper arm level, right arm, initial encounter: Secondary | ICD-10-CM | POA: Insufficient documentation

## 2022-09-11 DIAGNOSIS — S46811D Strain of other muscles, fascia and tendons at shoulder and upper arm level, right arm, subsequent encounter: Secondary | ICD-10-CM | POA: Diagnosis not present

## 2022-09-12 DIAGNOSIS — M7540 Impingement syndrome of unspecified shoulder: Secondary | ICD-10-CM | POA: Insufficient documentation

## 2022-09-30 ENCOUNTER — Ambulatory Visit
Admission: RE | Admit: 2022-09-30 | Discharge: 2022-09-30 | Disposition: A | Discharge status: Home / Self Care | Payer: Medicare Other | Source: Ambulatory Visit | Attending: Family Medicine | Admitting: Family Medicine

## 2022-09-30 DIAGNOSIS — I700 Atherosclerosis of aorta: Secondary | ICD-10-CM | POA: Diagnosis not present

## 2022-09-30 DIAGNOSIS — I3139 Other pericardial effusion (noninflammatory): Secondary | ICD-10-CM | POA: Diagnosis not present

## 2022-09-30 DIAGNOSIS — I77810 Thoracic aortic ectasia: Secondary | ICD-10-CM

## 2022-09-30 DIAGNOSIS — I10 Essential (primary) hypertension: Secondary | ICD-10-CM | POA: Diagnosis not present

## 2022-09-30 MED ORDER — GADOPICLENOL 0.5 MMOL/ML IV SOLN
5.0000 mL | Freq: Once | INTRAVENOUS | Status: AC | PRN
  Administered 2022-09-30: 5 mL via INTRAVENOUS

## 2022-10-23 DIAGNOSIS — H3562 Retinal hemorrhage, left eye: Secondary | ICD-10-CM | POA: Diagnosis not present

## 2022-10-26 DIAGNOSIS — H02006 Unspecified entropion of left eye, unspecified eyelid: Secondary | ICD-10-CM | POA: Diagnosis not present

## 2022-10-26 DIAGNOSIS — H02005 Unspecified entropion of left lower eyelid: Secondary | ICD-10-CM | POA: Diagnosis not present

## 2022-10-27 IMAGING — MG MM DIGITAL SCREENING BILAT W/ TOMO AND CAD
8 series · 9 of 24 positions shown · non-contrast
Comparison: Previous exam(s).

CLINICAL DATA: Screening.

EXAM:
DIGITAL SCREENING BILATERAL MAMMOGRAM WITH TOMOSYNTHESIS AND CAD
TECHNIQUE: Bilateral screening digital craniocaudal and mediolateral oblique
mammograms were obtained. Bilateral screening digital breast
tomosynthesis was performed. The images were evaluated with
computer-aided detection.

[R CC synth-2D]
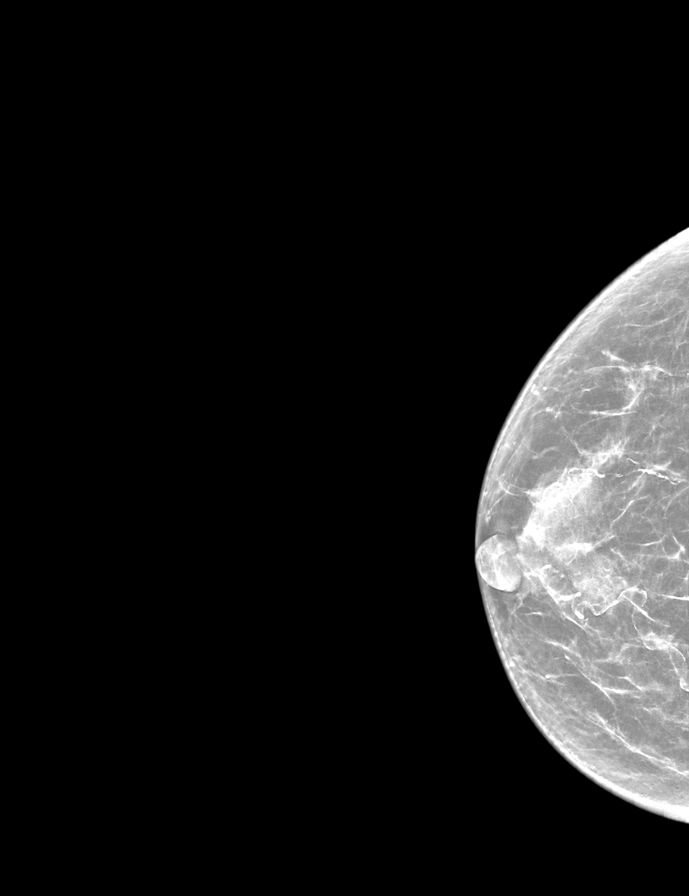

[L CC synth-2D]
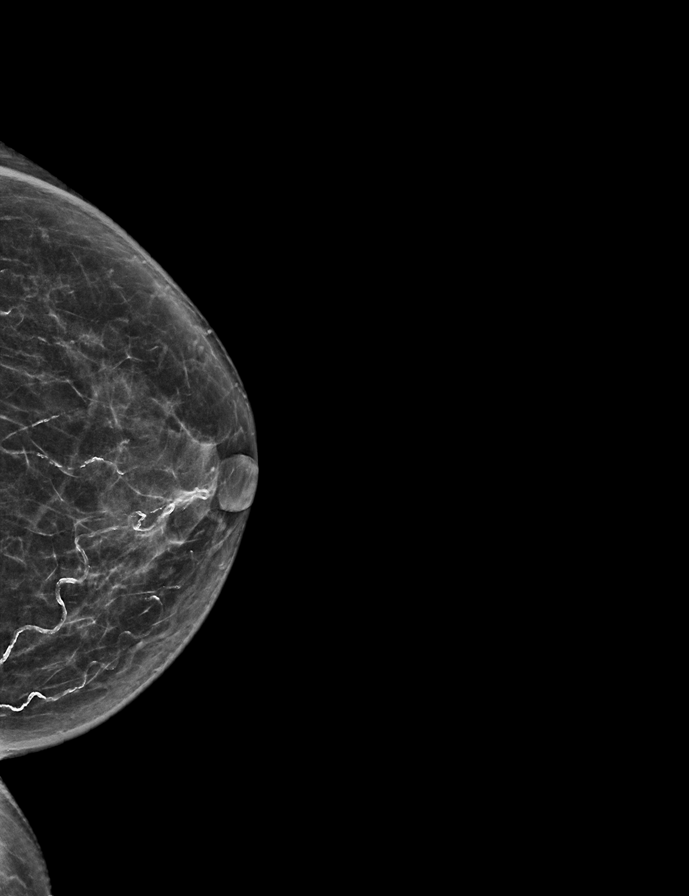

[L MLO synth-2D]
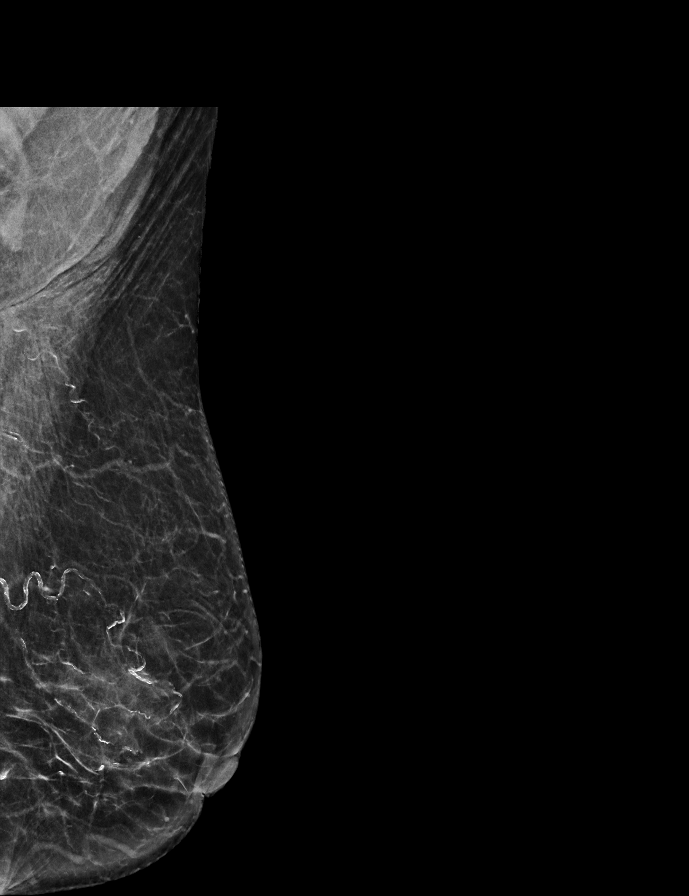

[R MLO synth-2D]
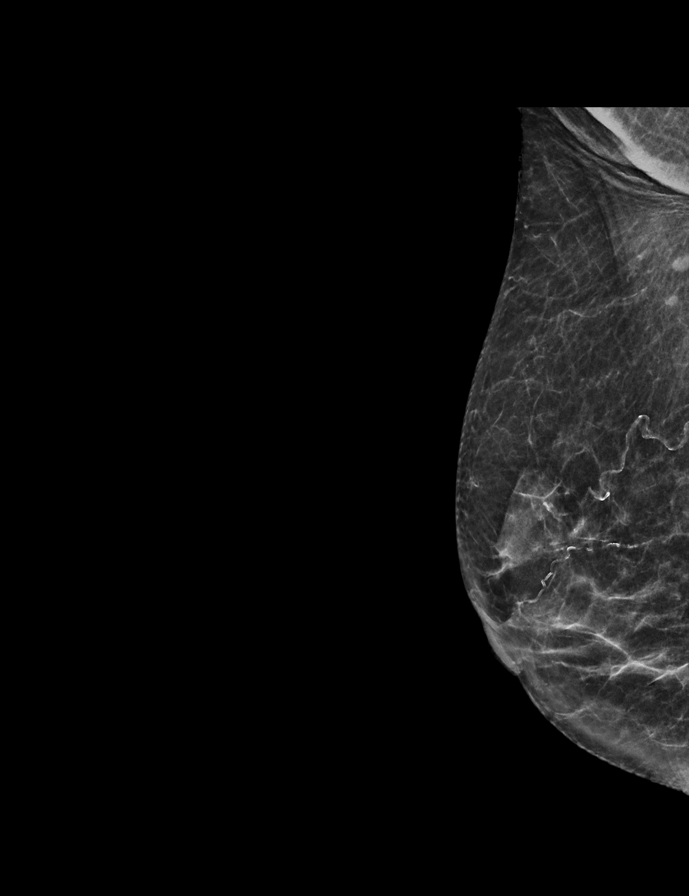

[L CC tomo · 2 of 58 frames shown]
[frame 19/58]
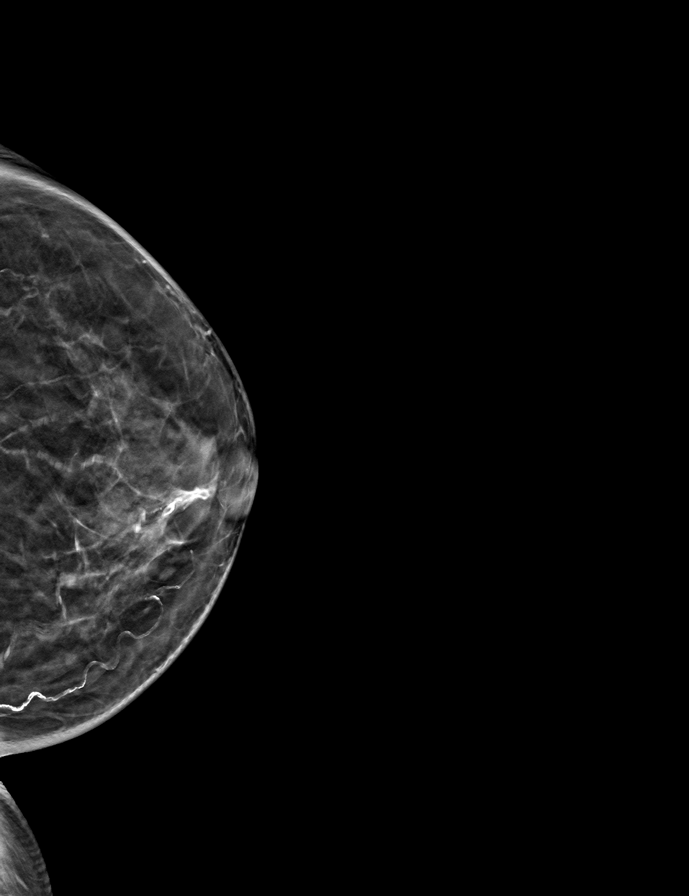
[frame 29/58]
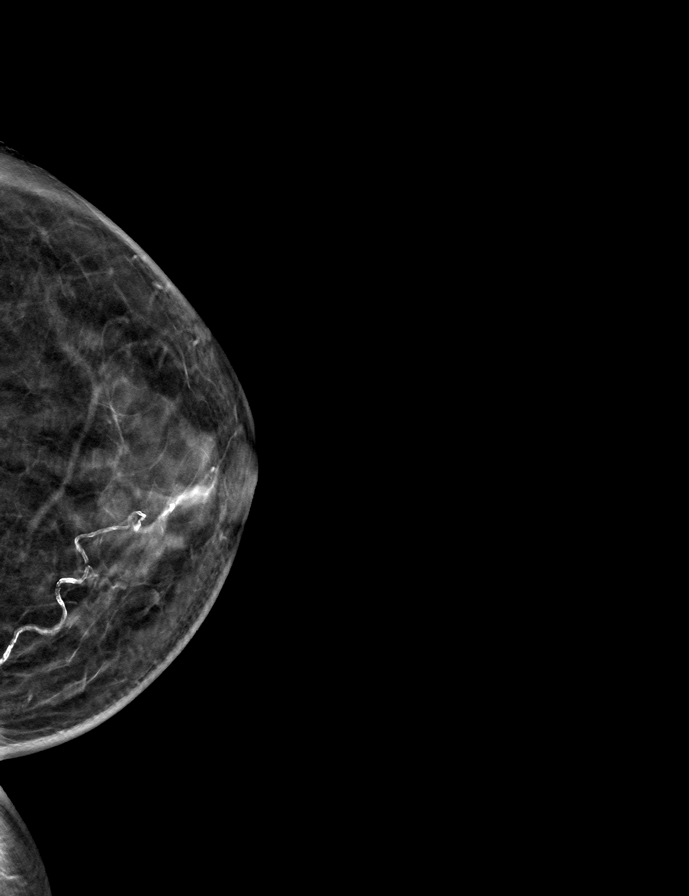

[L MLO tomo · tomo slice 31/60.0]
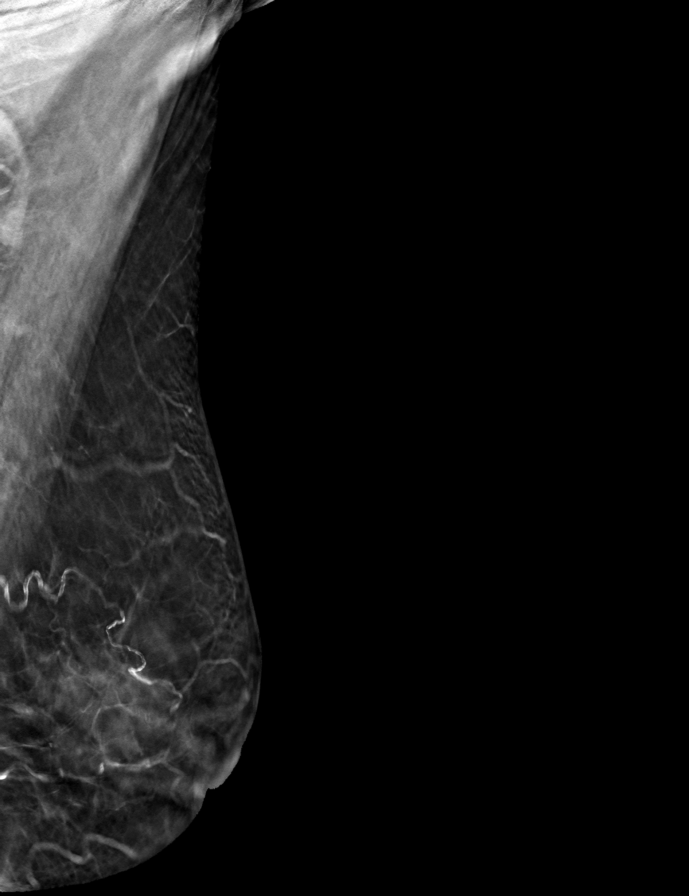

[R CC tomo · tomo slice 30/59.0]
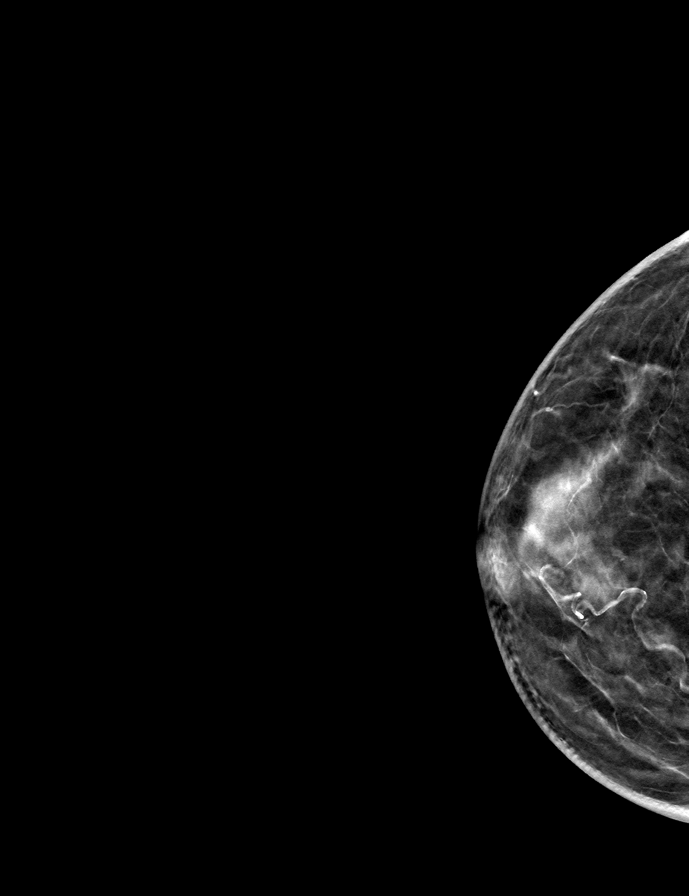

[R MLO tomo · tomo slice 27/53.0]
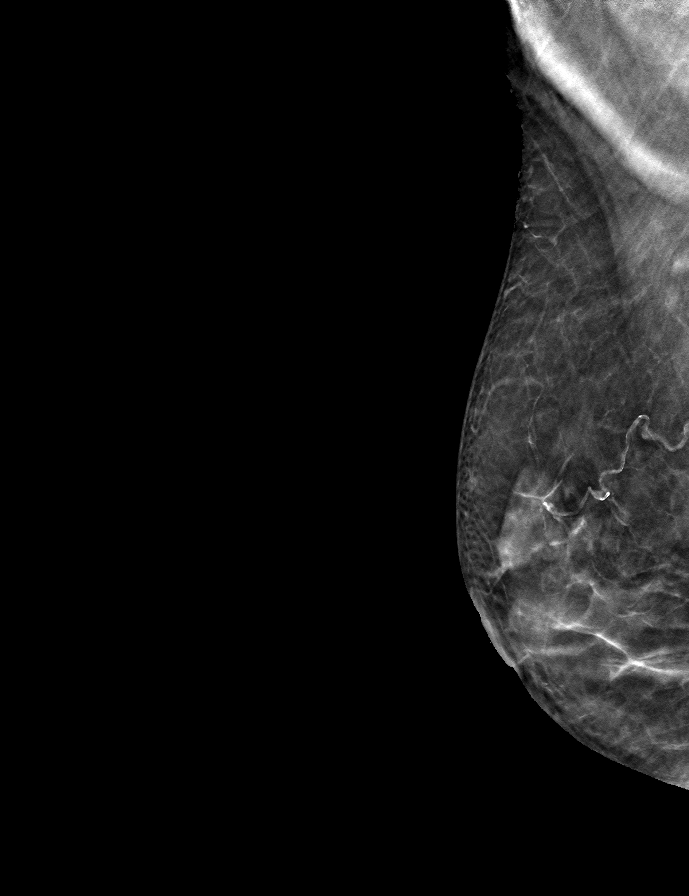

[9 of 24 positions shown; findings below may reference images not displayed]

ACR Breast Density Category b: There are scattered areas of
fibroglandular density.
FINDINGS: There are no findings suspicious for malignancy.
IMPRESSION: No mammographic evidence of malignancy. A result letter of this
screening mammogram will be mailed directly to the patient.

RECOMMENDATION:
Screening mammogram in one year. (Code:51-O-LD2)

BI-RADS CATEGORY  1: Negative.

## 2022-10-31 DIAGNOSIS — M7121 Synovial cyst of popliteal space [Baker], right knee: Secondary | ICD-10-CM | POA: Diagnosis not present

## 2022-10-31 DIAGNOSIS — M7122 Synovial cyst of popliteal space [Baker], left knee: Secondary | ICD-10-CM | POA: Diagnosis not present

## 2022-11-14 ENCOUNTER — Ambulatory Visit
Admission: RE | Admit: 2022-11-14 | Discharge: 2022-11-14 | Disposition: A | Discharge status: Home / Self Care | Payer: 59 | Source: Ambulatory Visit | Attending: Sports Medicine | Admitting: Sports Medicine

## 2022-11-14 ENCOUNTER — Other Ambulatory Visit: Payer: Self-pay | Admitting: Sports Medicine

## 2022-11-14 DIAGNOSIS — M2341 Loose body in knee, right knee: Secondary | ICD-10-CM | POA: Diagnosis not present

## 2022-11-14 DIAGNOSIS — M1711 Unilateral primary osteoarthritis, right knee: Secondary | ICD-10-CM | POA: Diagnosis not present

## 2022-11-14 DIAGNOSIS — M7121 Synovial cyst of popliteal space [Baker], right knee: Secondary | ICD-10-CM | POA: Diagnosis not present

## 2022-11-14 DIAGNOSIS — M25561 Pain in right knee: Secondary | ICD-10-CM

## 2022-11-14 DIAGNOSIS — M7122 Synovial cyst of popliteal space [Baker], left knee: Secondary | ICD-10-CM | POA: Diagnosis not present

## 2022-11-14 DIAGNOSIS — M25562 Pain in left knee: Secondary | ICD-10-CM | POA: Diagnosis not present

## 2022-11-21 DIAGNOSIS — H16141 Punctate keratitis, right eye: Secondary | ICD-10-CM | POA: Diagnosis not present

## 2022-11-27 DIAGNOSIS — I10 Essential (primary) hypertension: Secondary | ICD-10-CM | POA: Diagnosis not present

## 2022-11-27 DIAGNOSIS — I700 Atherosclerosis of aorta: Secondary | ICD-10-CM | POA: Diagnosis not present

## 2022-11-27 DIAGNOSIS — G319 Degenerative disease of nervous system, unspecified: Secondary | ICD-10-CM | POA: Diagnosis not present

## 2022-12-12 DIAGNOSIS — M7121 Synovial cyst of popliteal space [Baker], right knee: Secondary | ICD-10-CM | POA: Diagnosis not present

## 2022-12-12 DIAGNOSIS — M7122 Synovial cyst of popliteal space [Baker], left knee: Secondary | ICD-10-CM | POA: Diagnosis not present

## 2022-12-12 DIAGNOSIS — M79674 Pain in right toe(s): Secondary | ICD-10-CM | POA: Diagnosis not present

## 2022-12-13 DIAGNOSIS — H02005 Unspecified entropion of left lower eyelid: Secondary | ICD-10-CM | POA: Diagnosis not present

## 2022-12-28 DIAGNOSIS — M7121 Synovial cyst of popliteal space [Baker], right knee: Secondary | ICD-10-CM | POA: Diagnosis not present

## 2023-04-18 DIAGNOSIS — M47816 Spondylosis without myelopathy or radiculopathy, lumbar region: Secondary | ICD-10-CM | POA: Diagnosis not present

## 2023-04-18 DIAGNOSIS — Z6820 Body mass index (BMI) 20.0-20.9, adult: Secondary | ICD-10-CM | POA: Diagnosis not present

## 2023-04-18 DIAGNOSIS — G629 Polyneuropathy, unspecified: Secondary | ICD-10-CM | POA: Diagnosis not present

## 2023-04-18 DIAGNOSIS — M7121 Synovial cyst of popliteal space [Baker], right knee: Secondary | ICD-10-CM | POA: Diagnosis not present

## 2023-04-18 DIAGNOSIS — M7122 Synovial cyst of popliteal space [Baker], left knee: Secondary | ICD-10-CM | POA: Diagnosis not present

## 2023-05-09 DIAGNOSIS — M47816 Spondylosis without myelopathy or radiculopathy, lumbar region: Secondary | ICD-10-CM | POA: Diagnosis not present

## 2023-05-09 DIAGNOSIS — G629 Polyneuropathy, unspecified: Secondary | ICD-10-CM | POA: Diagnosis not present

## 2023-05-30 DIAGNOSIS — M7122 Synovial cyst of popliteal space [Baker], left knee: Secondary | ICD-10-CM | POA: Diagnosis not present

## 2023-05-30 DIAGNOSIS — M7121 Synovial cyst of popliteal space [Baker], right knee: Secondary | ICD-10-CM | POA: Diagnosis not present

## 2023-06-18 DIAGNOSIS — E781 Pure hyperglyceridemia: Secondary | ICD-10-CM | POA: Diagnosis not present

## 2023-06-18 DIAGNOSIS — Z23 Encounter for immunization: Secondary | ICD-10-CM | POA: Diagnosis not present

## 2023-06-18 DIAGNOSIS — I10 Essential (primary) hypertension: Secondary | ICD-10-CM | POA: Diagnosis not present

## 2023-06-18 DIAGNOSIS — Z681 Body mass index (BMI) 19 or less, adult: Secondary | ICD-10-CM | POA: Diagnosis not present

## 2023-06-18 DIAGNOSIS — M4726 Other spondylosis with radiculopathy, lumbar region: Secondary | ICD-10-CM | POA: Diagnosis not present

## 2023-07-09 DIAGNOSIS — H02003 Unspecified entropion of right eye, unspecified eyelid: Secondary | ICD-10-CM | POA: Diagnosis not present

## 2023-08-28 DIAGNOSIS — Z0000 Encounter for general adult medical examination without abnormal findings: Secondary | ICD-10-CM | POA: Diagnosis not present

## 2023-08-29 ENCOUNTER — Other Ambulatory Visit: Payer: Self-pay | Admitting: Family Medicine

## 2023-08-29 DIAGNOSIS — Z1231 Encounter for screening mammogram for malignant neoplasm of breast: Secondary | ICD-10-CM

## 2023-09-27 ENCOUNTER — Ambulatory Visit
Admission: RE | Admit: 2023-09-27 | Discharge: 2023-09-27 | Disposition: A | Discharge status: Home / Self Care | Payer: 59 | Source: Ambulatory Visit | Attending: Family Medicine | Admitting: Family Medicine

## 2023-09-27 DIAGNOSIS — Z1231 Encounter for screening mammogram for malignant neoplasm of breast: Secondary | ICD-10-CM | POA: Diagnosis not present

## 2023-10-17 DIAGNOSIS — M79674 Pain in right toe(s): Secondary | ICD-10-CM | POA: Diagnosis not present

## 2023-10-29 ENCOUNTER — Encounter: Payer: Self-pay | Admitting: Podiatry

## 2023-10-29 ENCOUNTER — Ambulatory Visit (INDEPENDENT_AMBULATORY_CARE_PROVIDER_SITE_OTHER): Payer: 59 | Admitting: Podiatry

## 2023-10-29 DIAGNOSIS — R2242 Localized swelling, mass and lump, left lower limb: Secondary | ICD-10-CM | POA: Diagnosis not present

## 2023-10-29 DIAGNOSIS — M79671 Pain in right foot: Secondary | ICD-10-CM | POA: Diagnosis not present

## 2023-10-29 DIAGNOSIS — M67471 Ganglion, right ankle and foot: Secondary | ICD-10-CM | POA: Diagnosis not present

## 2023-10-29 DIAGNOSIS — R2241 Localized swelling, mass and lump, right lower limb: Secondary | ICD-10-CM

## 2023-10-29 NOTE — Progress Notes (Unsigned)
Attempted aspiration of ganglionic cyst on dorsal aspect of hallux.  Will try to send the dry tap off to the lab for examination.  Steroid injected as well.  Recheck and possible plan for excision

## 2023-11-06 ENCOUNTER — Other Ambulatory Visit: Payer: Self-pay | Admitting: Podiatry

## 2023-11-13 NOTE — Progress Notes (Signed)
Received the pathology results from the sample sent when I tried to drain the possible cyst on top of her great toe.  It was a dry tap, so they only had a few cells to look at.  It was negative for malignancy, and didn't show any cells that are consistent with a ganglion cyst.  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  If she'd like to schedule surgical excision to remove it altogether, we can schedule it at the [facility] surgery center to be performed under sterile conditions.    Her daughter helps translate for her and may be best to speak with unless we can arrange a 3-way call with an interpreter.  She is from the [facility] office.    Please notify patient and see how she wants to proceed.   Thank you, Dr. Burna Mortimer

## 2023-11-26 ENCOUNTER — Ambulatory Visit: Payer: 59 | Admitting: Podiatry

## 2023-12-03 DIAGNOSIS — H60503 Unspecified acute noninfective otitis externa, bilateral: Secondary | ICD-10-CM | POA: Diagnosis not present

## 2023-12-03 DIAGNOSIS — I10 Essential (primary) hypertension: Secondary | ICD-10-CM | POA: Diagnosis not present

## 2023-12-04 ENCOUNTER — Encounter: Payer: Self-pay | Admitting: Podiatry

## 2023-12-04 ENCOUNTER — Ambulatory Visit (INDEPENDENT_AMBULATORY_CARE_PROVIDER_SITE_OTHER): Admitting: Podiatry

## 2023-12-04 DIAGNOSIS — R2241 Localized swelling, mass and lump, right lower limb: Secondary | ICD-10-CM | POA: Diagnosis not present

## 2023-12-04 DIAGNOSIS — M79671 Pain in right foot: Secondary | ICD-10-CM | POA: Diagnosis not present

## 2023-12-04 NOTE — Progress Notes (Unsigned)
      Chief Complaint  Patient presents with   Toe Injury    Pt is here to f/u on cyst on right toe, states she would like to discuss surgery options, states the toe hurts sometimes at night, been there for 2-3 years.   HPI: 83 y.o. female presents today, with her daughter and an interpreter, to discuss excision of the mass on the dorsal aspect of the right great toe.  We had obtained a dry tap of the lesion at her last appointment and this was sent off to pathology.  The report noted possible fibroma or neuroma, but did not feel this would be a ganglion cyst.  Patient still has the mass and notes discomfort to the area and would like to proceed with planning surgical excision.  Past Medical History:  Diagnosis Date   Hypertension     Past Surgical History:  Procedure Laterality Date   CATARACT EXTRACTION, BILATERAL Bilateral 09/26/2011   LUMBAR DISC SURGERY  08/05/2010   L4-L5 MED   POSTERIOR LUMBAR FUSION  02/01/2011   L2-L5   PTERYGIUM EXCISION Left 11/15/2020   With graft, Left eye   No Known Allergies   Physical Exam: Palpable pedal pulses noted.  There is a well-defined circular mass palpated within the soft tissues on the dorsal aspect of the right hallux near the middle portion of the proximal phalanx.  This is mobile but firm.  Mass, upon palpation, feels less than 1 cm in diameter.  No surrounding erythema or ecchymosis is noted.  No ulceration is seen.  Epicritic sensation is intact  Assessment/Plan of Care: 1. Subcutaneous mass of right foot   2. Pain in right foot    Discussed clinical findings with patient today.  Will plan for surgical excision of the mass.  Patient prefers to have this performed in our office under local anesthesia.  Will need to arrange for an interpreter to be here early in the morning for her procedure that will be scheduled in the [facility] office-procedure room.  This will be under local anesthesia and the mass will be sent to pathology for  final identification.  She will have some sutures for closure as well as a surgical shoe and light dressing to the toe.  Discussed benefits, risks of surgery and anesthetic, and possible postoperative complications with the patient.  The interpreter was present and helped translate for the entire presurgical review.  All questions were answered.  Also discussed possible sequela if the patient did not proceed with any surgical intervention which would involve possible enlargement of the mass and more pain.  Verbal and written consent were obtained today   Amrita Radu DBurna Mortimer, DPM, FACFAS [facility]     [address] [place], [place] [address]                Office [phone]  Fax [phone]

## 2023-12-14 ENCOUNTER — Telehealth: Payer: Self-pay | Admitting: Podiatry

## 2023-12-14 NOTE — Telephone Encounter (Signed)
 DOS:12/24/23  EXC GANGLION TOE R-1---28092   EFFECTIVE DATE: 09/25/2018   CO INSURANCE :   PER [employer] WE[employer]ITE Notification or Prior Authorization is not required for CPT CODE 78295 .You are not required to submit a notification/prior authorization based on the information provided. If you would like to request an organization determination, please call us at [phone]. Decision ID #: [id number]

## 2023-12-17 DIAGNOSIS — Z8673 Personal history of transient ischemic attack (TIA), and cerebral infarction without residual deficits: Secondary | ICD-10-CM | POA: Diagnosis not present

## 2023-12-17 DIAGNOSIS — E7800 Pure hypercholesterolemia, unspecified: Secondary | ICD-10-CM | POA: Diagnosis not present

## 2023-12-17 DIAGNOSIS — R7303 Prediabetes: Secondary | ICD-10-CM | POA: Diagnosis not present

## 2023-12-17 DIAGNOSIS — M4726 Other spondylosis with radiculopathy, lumbar region: Secondary | ICD-10-CM | POA: Diagnosis not present

## 2023-12-17 DIAGNOSIS — I10 Essential (primary) hypertension: Secondary | ICD-10-CM | POA: Diagnosis not present

## 2023-12-24 ENCOUNTER — Ambulatory Visit (INDEPENDENT_AMBULATORY_CARE_PROVIDER_SITE_OTHER): Admitting: Podiatry

## 2023-12-24 ENCOUNTER — Encounter: Payer: Self-pay | Admitting: *Deleted

## 2023-12-24 DIAGNOSIS — M67471 Ganglion, right ankle and foot: Secondary | ICD-10-CM

## 2023-12-24 DIAGNOSIS — R2241 Localized swelling, mass and lump, right lower limb: Secondary | ICD-10-CM

## 2023-12-24 DIAGNOSIS — D2121 Benign neoplasm of connective and other soft tissue of right lower limb, including hip: Secondary | ICD-10-CM | POA: Diagnosis not present

## 2023-12-24 NOTE — Progress Notes (Unsigned)
 Specimen to [facility] sent out today with FEDEX - drop box at [employer] [phone]

## 2023-12-24 NOTE — Progress Notes (Signed)
      HPI: 83 y.o. female presents today with her daughter for excision of painful soft tissue mass dorsal right great toe.  The notes that the lesion has gotten larger since last seen.  Role and written consent were obtained on the prior visit.  Past Medical History:  Diagnosis Date   Hypertension     Past Surgical History:  Procedure Laterality Date   CATARACT EXTRACTION, BILATERAL Bilateral 09/26/2011   LUMBAR DISC SURGERY  08/05/2010   L4-L5 MED   POSTERIOR LUMBAR FUSION  02/01/2011   L2-L5   PTERYGIUM EXCISION Left 11/15/2020   With graft, Left eye    No Known Allergies   Physical Exam: Notable pedal pulses noted right foot.  There is a fairly well-defined soft tissue mass on the dorsal aspect of the right hallux near the DIPJ.  This is mobile within the soft tissues.  No skin discoloration is noted.  Assessment/Plan of Care: 1. Subcutaneous mass of right foot     The patient and her daughter were brought back to the surgical procedure room.  The patient was reclined in the supine position.  Following confirmation of site of procedure and patient ID, the right hallux was anesthetized utilizing accommodation of 1% lidocaine plain and 0.5% plain for a hallux block.  The right foot was prepped and draped in the usual sterile manner.  A an ACE bandage was utilized as a mini tourniquet for the right hallux.  Following a sterile skin prep and confirmation of anesthesia to the toe a 3 cm linear incision was made centered over the mass over the hallux IPJ on the dorsal aspect of the toe.  The incision was deepened in the same plane pain careful attention to all neurovascular and tendinous donuts which were retracted.  The soft tissue mass was identified and very well-defined but irregularly shaped.  This did appear to have some fibrous and cyst like appearance to it.  Metzenbaum scissors were utilized to bluntly and sharply dissected around the mass which was excised and removed in toto  and placed in a formalin container for pathology.  The area was copiously lavaged with sterile saline  The subcutaneous tissues were reapproximated use of 3-0 Vicryl.  The skin was closed with use of 4-0 nylon in a running subcuticular fashion.  Xeroform gauze and a dry sterile dressing was applied to the foot.  Patient was instructed to keep this dressing on for 3 days then may begin showering at home.  They can leave the dressing on until her next appointment in 1 week.  She was placed in a surgical shoe to prevent bending at the toe while the incision is healing.  Patient tolerated the anesthesia and procedure well and left the operating room for home with vital signs stable in apparent satisfactory condition.  Clerance Lav, DPM, FACFAS [facility]     [address] [place], [place] [address]                Office [phone]  Fax [phone]

## 2023-12-31 ENCOUNTER — Ambulatory Visit (INDEPENDENT_AMBULATORY_CARE_PROVIDER_SITE_OTHER): Admitting: Podiatry

## 2023-12-31 DIAGNOSIS — Z9889 Other specified postprocedural states: Secondary | ICD-10-CM

## 2023-12-31 NOTE — Progress Notes (Unsigned)
       Subjective:  Patient ID: Emma Johnston, female    DOB: [birth year],  MRN: [id number]  Emma Johnston presents to clinic today for:  Chief Complaint  Patient presents with   Routine Post Op    Pt is here today for first post op visit DOS 12/24/23/EXC SOFT TISSUE MASS RT GREAT TOE Pt stated that she is doing well no pain or discomfort at this time no concerns denies any fever chills nausea or vomiting    Patient is 1 week postop after undergoing excision of painful soft tissue mass from dorsal right hallux .  Patient denies fever, chills, night sweats, nausea/vomiting.  Patient denies chest pain or shortness of breath.  Patient denies calf pain.  Her daughter is with her today.  The pathology report is not yet back.    Past Medical History:  Diagnosis Date   Hypertension     Past Surgical History:  Procedure Laterality Date   CATARACT EXTRACTION, BILATERAL Bilateral 09/26/2011   LUMBAR DISC SURGERY  08/05/2010   L4-L5 MED   POSTERIOR LUMBAR FUSION  02/01/2011   L2-L5   PTERYGIUM EXCISION Left 11/15/2020   With graft, Left eye    No Known Allergies  Objective:  Physical Examination: There are palpable pedal pulses.  There is minimal localized edema to the surgical area.  The incision is well-approximated and the sutures are holding well.  No active drainage is noted.  No clinical signs of infection are seen.  No signs of return of soft tissue mass noted.  Assessment/Plan: 1. Post-operative state     Sutures removed and steri-strips applied.  Light dressing applied.  Change daily at home.  Let steri-strips fall off on their own.  Continue with surgical shoe until next f/u in one week.  Return in about 1 week (around 01/07/2024) for post-op recheck.   Clerance Lav, DPM, FACFAS [facility]     [address] [place], [place] [address]                Office [phone]  Fax [phone]

## 2024-01-07 ENCOUNTER — Other Ambulatory Visit: Payer: Self-pay | Admitting: Podiatry

## 2024-01-09 ENCOUNTER — Telehealth: Payer: Self-pay

## 2024-01-09 NOTE — Telephone Encounter (Signed)
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  These have a low chance of recurrence.  No further treatment is needed at this time.  Thank you, Dr. McCaughan

## 2024-01-15 ENCOUNTER — Encounter: Payer: Self-pay | Admitting: Podiatry

## 2024-01-15 ENCOUNTER — Encounter: Admitting: Podiatry

## 2024-01-15 ENCOUNTER — Ambulatory Visit (INDEPENDENT_AMBULATORY_CARE_PROVIDER_SITE_OTHER): Admitting: Podiatry

## 2024-01-15 DIAGNOSIS — Z9889 Other specified postprocedural states: Secondary | ICD-10-CM

## 2024-01-15 NOTE — Progress Notes (Signed)
       Subjective:  Patient ID: Emma Johnston, female    DOB: 04-29-1941,  MRN: [id number]  Emma Johnston presents to clinic today for:  Chief Complaint  Patient presents with   Routine Post Op    POV #2 DOS 3/31 EXC SOFT TISSUE MASS RT GREAT TOE   Patient is 2 weeks postop after undergoing a excision of a painful soft tissue mass from the dorsal aspect of the right hallux..  Patient denies fever, chills, night sweats, nausea/vomiting.  Patient denies chest pain or shortness of breath.  Patient denies calf pain.  Patient has already been contacted to review the results of the pathology report.  Family member is with her today and is also interpreting.  Patient states that she has no questions and is very grateful.  She has no pain  Past Medical History:  Diagnosis Date   Hypertension     Past Surgical History:  Procedure Laterality Date   CATARACT EXTRACTION, BILATERAL Bilateral 09/26/2011   LUMBAR DISC SURGERY  08/05/2010   L4-L5 MED   POSTERIOR LUMBAR FUSION  02/01/2011   L2-L5   PTERYGIUM EXCISION Left 11/15/2020   With graft, Left eye    No Known Allergies  Objective:  Physical Examination: There are palpable pedal pulses.  There is no edema to the surgical area.  The incision is well-approximated and there are no signs of hypertrophy or keloid formation.  No active drainage is noted.  No clinical signs of infection are seen.  There is minimal hyperpigmentation around the surgical area.  Some dry flaky skin was noted along the incision.  No gaps or dehiscence are noted.  No signs of recurrence of the masses seen.  No pain on palpation is noted  Assessment/Plan: 1. Post-operative state    Patient instructed to massage antibiotic ointment or any type of scar cream to the area once daily.  This will decrease any dryness or itching of the skin while the area has healed.  Follow-up prn .   Joe Murders, DPM, FACFAS [facility]     [address] [place], [place] [address]                Office [phone]  Fax [phone]

## 2024-02-06 DIAGNOSIS — M7121 Synovial cyst of popliteal space [Baker], right knee: Secondary | ICD-10-CM | POA: Diagnosis not present

## 2024-02-06 DIAGNOSIS — M7122 Synovial cyst of popliteal space [Baker], left knee: Secondary | ICD-10-CM | POA: Diagnosis not present

## 2024-03-19 DIAGNOSIS — Z961 Presence of intraocular lens: Secondary | ICD-10-CM | POA: Diagnosis not present

## 2024-05-07 DIAGNOSIS — Z6820 Body mass index (BMI) 20.0-20.9, adult: Secondary | ICD-10-CM | POA: Diagnosis not present

## 2024-05-07 DIAGNOSIS — M25561 Pain in right knee: Secondary | ICD-10-CM | POA: Diagnosis not present

## 2024-05-07 DIAGNOSIS — M7121 Synovial cyst of popliteal space [Baker], right knee: Secondary | ICD-10-CM | POA: Diagnosis not present

## 2024-07-10 DIAGNOSIS — E7800 Pure hypercholesterolemia, unspecified: Secondary | ICD-10-CM | POA: Diagnosis not present

## 2024-07-10 DIAGNOSIS — I10 Essential (primary) hypertension: Secondary | ICD-10-CM | POA: Diagnosis not present

## 2024-07-10 DIAGNOSIS — R7303 Prediabetes: Secondary | ICD-10-CM | POA: Diagnosis not present

## 2024-07-10 DIAGNOSIS — L309 Dermatitis, unspecified: Secondary | ICD-10-CM | POA: Diagnosis not present

## 2024-07-10 DIAGNOSIS — M170 Bilateral primary osteoarthritis of knee: Secondary | ICD-10-CM | POA: Diagnosis not present

## 2024-07-10 DIAGNOSIS — Z23 Encounter for immunization: Secondary | ICD-10-CM | POA: Diagnosis not present

## 2024-07-10 DIAGNOSIS — M4726 Other spondylosis with radiculopathy, lumbar region: Secondary | ICD-10-CM | POA: Diagnosis not present

## 2024-08-29 ENCOUNTER — Other Ambulatory Visit (HOSPITAL_BASED_OUTPATIENT_CLINIC_OR_DEPARTMENT_OTHER): Payer: Self-pay | Admitting: Family Medicine

## 2024-08-29 DIAGNOSIS — Z780 Asymptomatic menopausal state: Secondary | ICD-10-CM
